# Patient Record
Sex: Male | Born: 1980 | Race: White | Hispanic: No | Marital: Married | Smoking: Former smoker
Health system: Southern US, Community
[De-identification: ages and names within clinical notes are randomized; demographics above are authoritative.]

## PROBLEM LIST (undated history)

## (undated) DIAGNOSIS — F419 Anxiety disorder, unspecified: Secondary | ICD-10-CM

## (undated) HISTORY — PX: FRACTURE SURGERY: SHX138

---

## 2015-11-06 DIAGNOSIS — H6123 Impacted cerumen, bilateral: Secondary | ICD-10-CM | POA: Diagnosis not present

## 2015-11-13 DIAGNOSIS — Z0000 Encounter for general adult medical examination without abnormal findings: Secondary | ICD-10-CM | POA: Diagnosis not present

## 2016-01-21 ENCOUNTER — Emergency Department
Admission: EM | Admit: 2016-01-21 | Discharge: 2016-01-21 | Disposition: A | Discharge status: Home / Self Care | Payer: BLUE CROSS/BLUE SHIELD | Attending: Emergency Medicine | Admitting: Emergency Medicine

## 2016-01-21 ENCOUNTER — Emergency Department: Payer: BLUE CROSS/BLUE SHIELD

## 2016-01-21 DIAGNOSIS — R079 Chest pain, unspecified: Secondary | ICD-10-CM

## 2016-01-21 DIAGNOSIS — Z79899 Other long term (current) drug therapy: Secondary | ICD-10-CM | POA: Insufficient documentation

## 2016-01-21 DIAGNOSIS — Z87891 Personal history of nicotine dependence: Secondary | ICD-10-CM | POA: Insufficient documentation

## 2016-01-21 DIAGNOSIS — K210 Gastro-esophageal reflux disease with esophagitis: Secondary | ICD-10-CM | POA: Diagnosis not present

## 2016-01-21 DIAGNOSIS — I10 Essential (primary) hypertension: Secondary | ICD-10-CM | POA: Diagnosis not present

## 2016-01-21 DIAGNOSIS — E784 Other hyperlipidemia: Secondary | ICD-10-CM | POA: Diagnosis not present

## 2016-01-21 DIAGNOSIS — R0789 Other chest pain: Secondary | ICD-10-CM | POA: Diagnosis not present

## 2016-01-21 HISTORY — DX: Anxiety disorder, unspecified: F41.9

## 2016-01-21 LAB — CBC
HEMATOCRIT: 45.4 % (ref 40.0–52.0)
HEMOGLOBIN: 15.9 g/dL (ref 13.0–18.0)
MCH: 30.9 pg (ref 26.0–34.0)
MCHC: 35 g/dL (ref 32.0–36.0)
MCV: 88.4 fL (ref 80.0–100.0)
Platelets: 331 10*3/uL (ref 150–440)
RBC: 5.14 MIL/uL (ref 4.40–5.90)
RDW: 13.3 % (ref 11.5–14.5)
WBC: 10.6 10*3/uL (ref 3.8–10.6)

## 2016-01-21 LAB — BASIC METABOLIC PANEL
ANION GAP: 7 (ref 5–15)
BUN: 15 mg/dL (ref 6–20)
CHLORIDE: 103 mmol/L (ref 101–111)
CO2: 26 mmol/L (ref 22–32)
Calcium: 9.7 mg/dL (ref 8.9–10.3)
Creatinine, Ser: 0.82 mg/dL (ref 0.61–1.24)
GFR calc Af Amer: >60 mL/min (ref >60)
GLUCOSE: 95 mg/dL (ref 65–99)
POTASSIUM: 3.9 mmol/L (ref 3.5–5.1)
Sodium: 136 mmol/L (ref 135–145)

## 2016-01-21 LAB — TROPONIN I: Troponin I: <0.03 ng/mL (ref <0.03)

## 2016-01-21 MED ORDER — ASPIRIN 81 MG PO CHEW
324.0000 mg | CHEWABLE_TABLET | Freq: Once | ORAL | Status: AC
  Administered 2016-01-21: 324 mg via ORAL
  Filled 2016-01-21: qty 4

## 2016-01-21 NOTE — Discharge Instructions (Signed)

## 2016-01-21 NOTE — ED Provider Notes (Signed)
[facility] Emergency Department Provider Note  ____________________________________________  Time seen: Approximately 11:07 AM  I have reviewed the triage vital signs and the nursing notes.   HISTORY  Chief Complaint Chest Pain   HPI Patrick Hudson is a 35 y.o. male with a history of anxiety and former smoker who presents for evaluation of chest pain. Patient reports multiple daily episodes of left-sided chest pain radiating to his shoulder blade and his left arm the last 10-15 minutes at a time and resolved with no intervention. These episodes happened both at rest and with exertion. His last episode was at 8:30 AM this morning while he was driving to work. He reports that the pain is usually moderate, dull, not associated with shortness of breath, diaphoresis, nausea, vomiting. Patient reports history of a heart attack in his grandfather in his 7560s. Patient denies any other family history of ischemic heart disease. Patient stopped smoking 9 years ago. No personal or family history of blood clots, no recent travel or immobilization, no leg pain or swelling, no exogenous hormones, no hemoptysis. Patient's last physical exam was in September and everything was normal.  Past Medical History:  Diagnosis Date  . Anxiety     There are no active problems to display for this patient.   Past Surgical History:  Procedure Laterality Date  . FRACTURE SURGERY      Prior to Admission medications   Medication Sig Start Date End Date Taking? Authorizing Provider  citalopram (CELEXA) 20 MG tablet Take 1 tablet by mouth daily. 12/23/15  Yes Historical Provider, MD  LORazepam (ATIVAN) 1 MG tablet Take 1 tablet by mouth at bedtime.  10/23/15  Yes Historical Provider, MD  ranitidine (ZANTAC) 300 MG tablet Take 1 tablet by mouth daily. 10/23/15  Yes Historical Provider, MD    Allergies Patient has no known allergies.  No family history on file.  Social  History Social History  Substance Use Topics  . Smoking status: Former Games developermoker  . Smokeless tobacco: Former NeurosurgeonUser  . Alcohol use No    Review of Systems  Constitutional: Negative for fever. Eyes: Negative for visual changes. ENT: Negative for sore throat. Neck: No neck pain  Cardiovascular: + chest pain. Respiratory: Negative for shortness of breath. Gastrointestinal: Negative for abdominal pain, vomiting or diarrhea. Genitourinary: Negative for dysuria. Musculoskeletal: Negative for back pain. Skin: Negative for rash. Neurological: Negative for headaches, weakness or numbness. Psych: No SI or HI  ____________________________________________   PHYSICAL EXAM:  VITAL SIGNS: ED Triage Vitals  Enc Vitals Group     BP 01/21/16 0947 128/84     Pulse Rate 01/21/16 0947 75     Resp 01/21/16 0947 18     Temp 01/21/16 0947 97.5 F (36.4 C)     Temp Source 01/21/16 0947 Oral     SpO2 01/21/16 0947 100 %     Weight 01/21/16 0948 269 lb (122 kg)     Height 01/21/16 0948 6\' 1"  (1.854 m)     Head Circumference --      Peak Flow --      Pain Score --      Pain Loc --      Pain Edu? --      Excl. in GC? --     Constitutional: Alert and oriented. Well appearing and in no apparent distress. HEENT:      Head: Normocephalic and atraumatic.         Eyes: Conjunctivae are normal. Sclera is  non-icteric. EOMI. PERRL      Mouth/Throat: Mucous membranes are moist.       Neck: Supple with no signs of meningismus. Cardiovascular: Regular rate and rhythm. No murmurs, gallops, or rubs. 2+ symmetrical distal pulses are present in all extremities. No JVD. Respiratory: Normal respiratory effort. Lungs are clear to auscultation bilaterally. No wheezes, crackles, or rhonchi.  Gastrointestinal: Soft, non tender, and non distended with positive bowel sounds. No rebound or guarding. Genitourinary: No CVA tenderness. Musculoskeletal: Nontender with normal range of motion in all extremities. No edema,  cyanosis, or erythema of extremities. Neurologic: Normal speech and language. Face is symmetric. Moving all extremities. No gross focal neurologic deficits are appreciated. Skin: Skin is warm, dry and intact. No rash noted. Psychiatric: Mood and affect are normal. Speech and behavior are normal.  ____________________________________________   LABS (all labs ordered are listed, but only abnormal results are displayed)  Labs Reviewed  BASIC METABOLIC PANEL  CBC  TROPONIN I  TROPONIN I   ____________________________________________  EKG  ED ECG REPORT I, Nita Sicklearolina Jaedin Trumbo, the attending physician, personally viewed and interpreted this ECG.  Normal sinus rhythm, rate of 87, normal intervals, normal axis, no ST elevations or depressions. No prior for comparison ____________________________________________  RADIOLOGY  CXR:  Negative ____________________________________________   PROCEDURES  Procedure(s) performed: None Procedures Critical Care performed:  None ____________________________________________   INITIAL IMPRESSION / ASSESSMENT AND PLAN / ED COURSE  Chest pain in a 35 y.o. male with low suspicion for cardiac (HEART score 2) or other serious etiology (including aortic dissection, pneumonia, pneumothorax, or pulmonary embolism) based his history and physical exam in the ED today. EKG normal. Plan for labs including CBC, chemistries and troponin now and in 3 hours, CXR and re-evaluation for disposition. Will give full dose ASA . Will observe patient on cardiac monitor while in the ED and pain control. I discussed admission vs close f/u with cardiology for stress test as outpatient and patient would like to f/u with cardiology as outpatient. Will discuss with cardiology for a close follow up.     Clinical Course as of Jan 21 1332  Tue Jan 21, 2016  1114 Discussed patient's presentation, vitals, lab and EKG with DR. Welton FlakesKhan, cardiology on call who will se patient for f/u  today at [facility] in his clinic. 2nd troponin pending at 1PM, if negative will dc with f/u at [facility].  [CV]    Clinical Course User Index [CV] Nita Sicklearolina Dameion Briles, MD    Pertinent labs & imaging results that were available during my care of the patient were reviewed by me and considered in my medical decision making (see chart for details).    ____________________________________________   FINAL CLINICAL IMPRESSION(S) / ED DIAGNOSES  Final diagnoses:  Chest pain, unspecified type      NEW MEDICATIONS STARTED DURING THIS VISIT:  New Prescriptions   No medications on file     Note:  This document was prepared using Dragon voice recognition software and may include unintentional dictation errors.    Nita Sicklearolina Nasim Habeeb, MD 01/21/16 [phone]

## 2016-01-21 NOTE — ED Notes (Signed)
Patient returned to treatment room from xray. Patient is in no obvious distress at this time.

## 2016-01-21 NOTE — ED Triage Notes (Signed)
Pt c/o intermittent left side chest pain that radiates into the left arm for the past couple of days.. Pt states he has anxiety, states he is suppose to take ativan when he has an attack but did not today because it felt different..Patrick Hudson

## 2016-01-22 DIAGNOSIS — R0789 Other chest pain: Secondary | ICD-10-CM | POA: Diagnosis not present

## 2016-01-22 DIAGNOSIS — K219 Gastro-esophageal reflux disease without esophagitis: Secondary | ICD-10-CM | POA: Diagnosis not present

## 2016-01-22 DIAGNOSIS — R079 Chest pain, unspecified: Secondary | ICD-10-CM | POA: Diagnosis not present

## 2016-01-24 DIAGNOSIS — K210 Gastro-esophageal reflux disease with esophagitis: Secondary | ICD-10-CM | POA: Diagnosis not present

## 2016-01-24 DIAGNOSIS — E784 Other hyperlipidemia: Secondary | ICD-10-CM | POA: Diagnosis not present

## 2016-01-24 DIAGNOSIS — I10 Essential (primary) hypertension: Secondary | ICD-10-CM | POA: Diagnosis not present

## 2016-01-24 DIAGNOSIS — R079 Chest pain, unspecified: Secondary | ICD-10-CM | POA: Diagnosis not present

## 2016-01-24 DIAGNOSIS — R0789 Other chest pain: Secondary | ICD-10-CM | POA: Diagnosis not present

## 2016-03-26 DIAGNOSIS — R079 Chest pain, unspecified: Secondary | ICD-10-CM | POA: Diagnosis not present

## 2016-03-26 DIAGNOSIS — E784 Other hyperlipidemia: Secondary | ICD-10-CM | POA: Diagnosis not present

## 2016-04-29 DIAGNOSIS — F411 Generalized anxiety disorder: Secondary | ICD-10-CM | POA: Diagnosis not present

## 2016-04-29 DIAGNOSIS — K219 Gastro-esophageal reflux disease without esophagitis: Secondary | ICD-10-CM | POA: Diagnosis not present

## 2016-05-26 DIAGNOSIS — M546 Pain in thoracic spine: Secondary | ICD-10-CM | POA: Diagnosis not present

## 2016-09-24 DIAGNOSIS — I10 Essential (primary) hypertension: Secondary | ICD-10-CM | POA: Diagnosis not present

## 2016-09-24 DIAGNOSIS — K210 Gastro-esophageal reflux disease with esophagitis: Secondary | ICD-10-CM | POA: Diagnosis not present

## 2016-09-24 DIAGNOSIS — E784 Other hyperlipidemia: Secondary | ICD-10-CM | POA: Diagnosis not present

## 2016-10-21 DIAGNOSIS — Z0000 Encounter for general adult medical examination without abnormal findings: Secondary | ICD-10-CM | POA: Diagnosis not present

## 2016-10-21 DIAGNOSIS — M25511 Pain in right shoulder: Secondary | ICD-10-CM | POA: Diagnosis not present

## 2016-10-21 DIAGNOSIS — M546 Pain in thoracic spine: Secondary | ICD-10-CM | POA: Diagnosis not present

## 2016-10-28 DIAGNOSIS — K219 Gastro-esophageal reflux disease without esophagitis: Secondary | ICD-10-CM | POA: Diagnosis not present

## 2016-10-28 DIAGNOSIS — M542 Cervicalgia: Secondary | ICD-10-CM | POA: Diagnosis not present

## 2016-10-28 DIAGNOSIS — F411 Generalized anxiety disorder: Secondary | ICD-10-CM | POA: Diagnosis not present

## 2016-10-28 DIAGNOSIS — I10 Essential (primary) hypertension: Secondary | ICD-10-CM | POA: Diagnosis not present

## 2017-10-20 DIAGNOSIS — Z6836 Body mass index (BMI) 36.0-36.9, adult: Secondary | ICD-10-CM | POA: Diagnosis not present

## 2017-10-20 DIAGNOSIS — Z0000 Encounter for general adult medical examination without abnormal findings: Secondary | ICD-10-CM | POA: Diagnosis not present

## 2017-12-21 IMAGING — CR DG CHEST 2V
2 series · 2 of 2 positions shown · non-contrast
Comparison: None.

CLINICAL DATA: Chest pain intermittently for 2-3 days.

EXAM:
CHEST  2 VIEW

[chest pa]
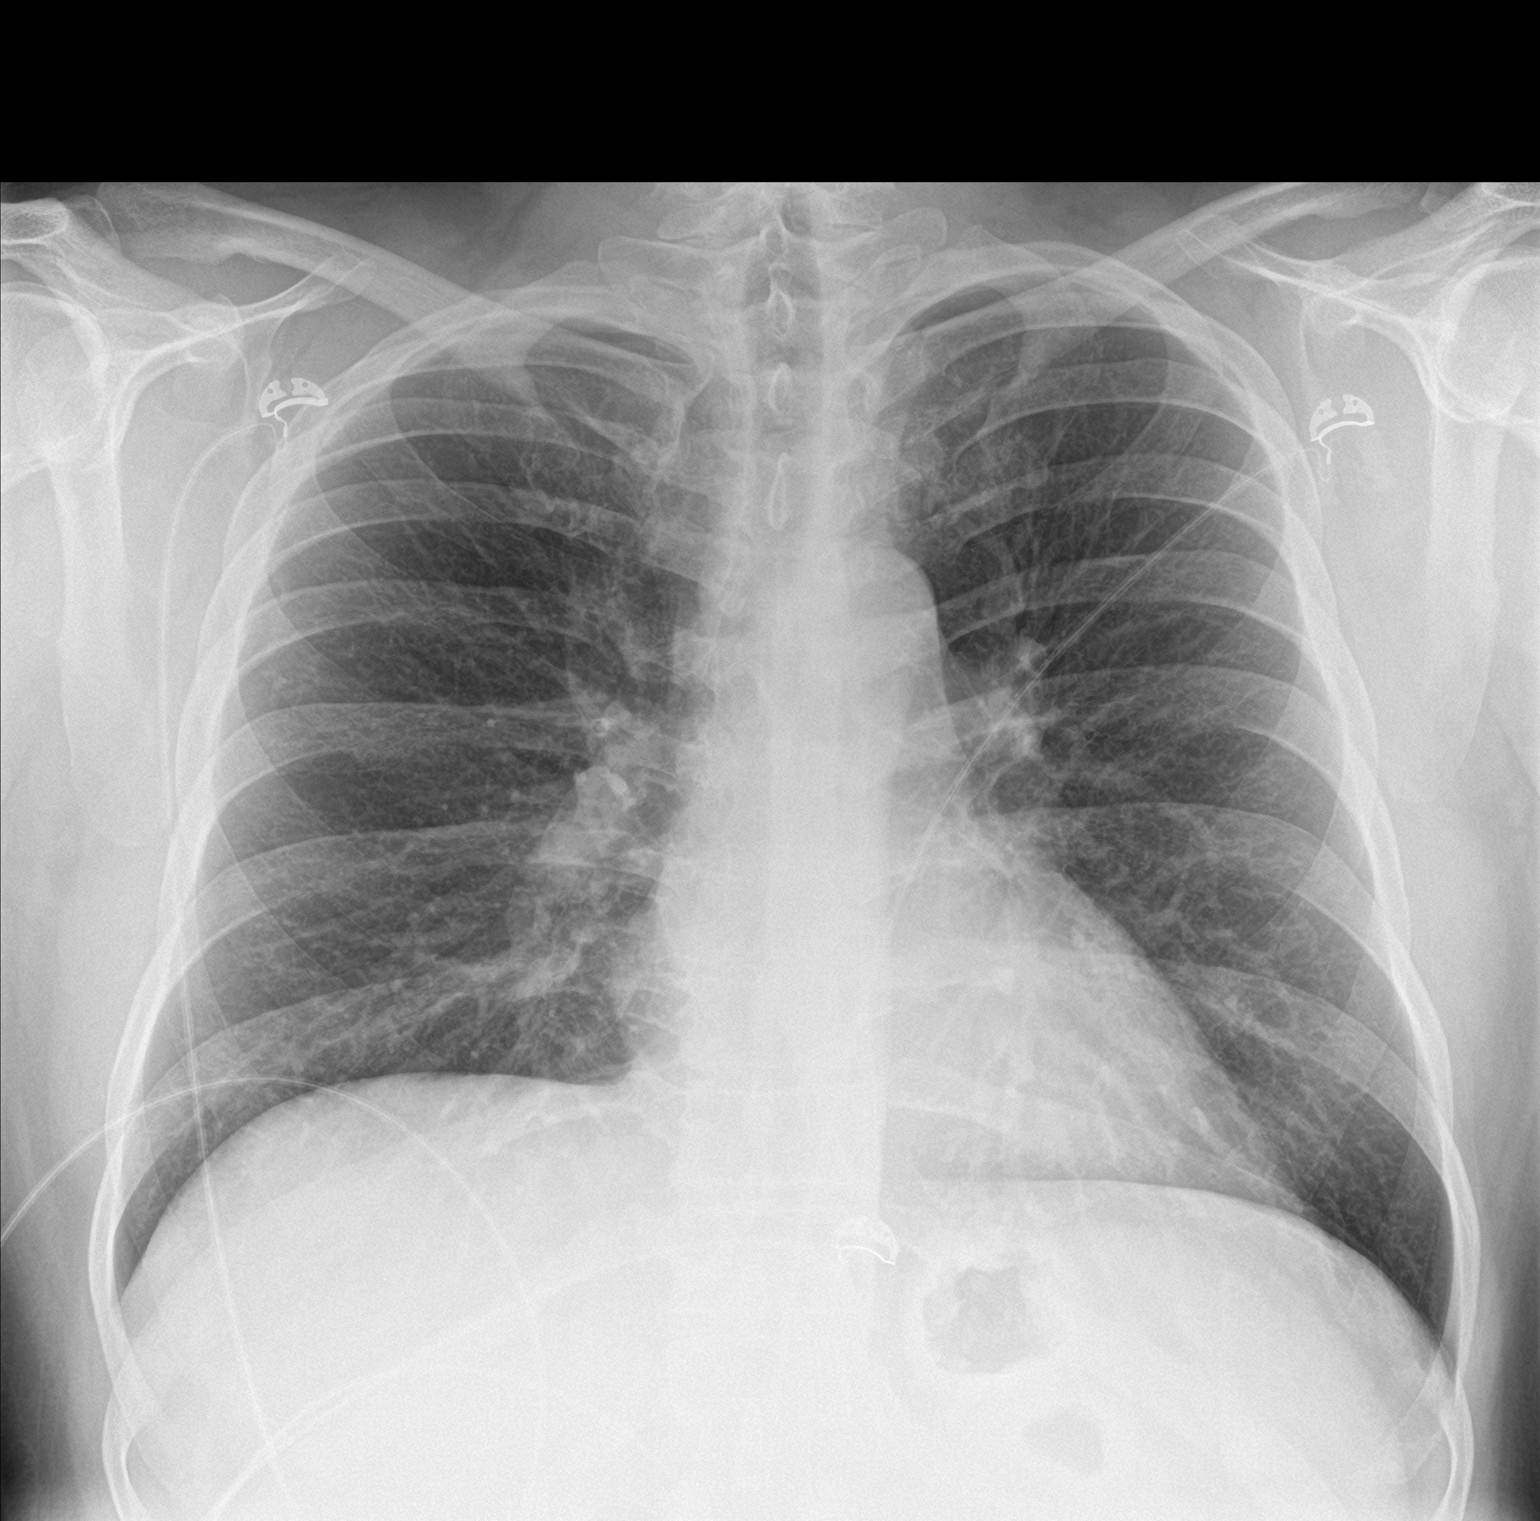

[chest lat]
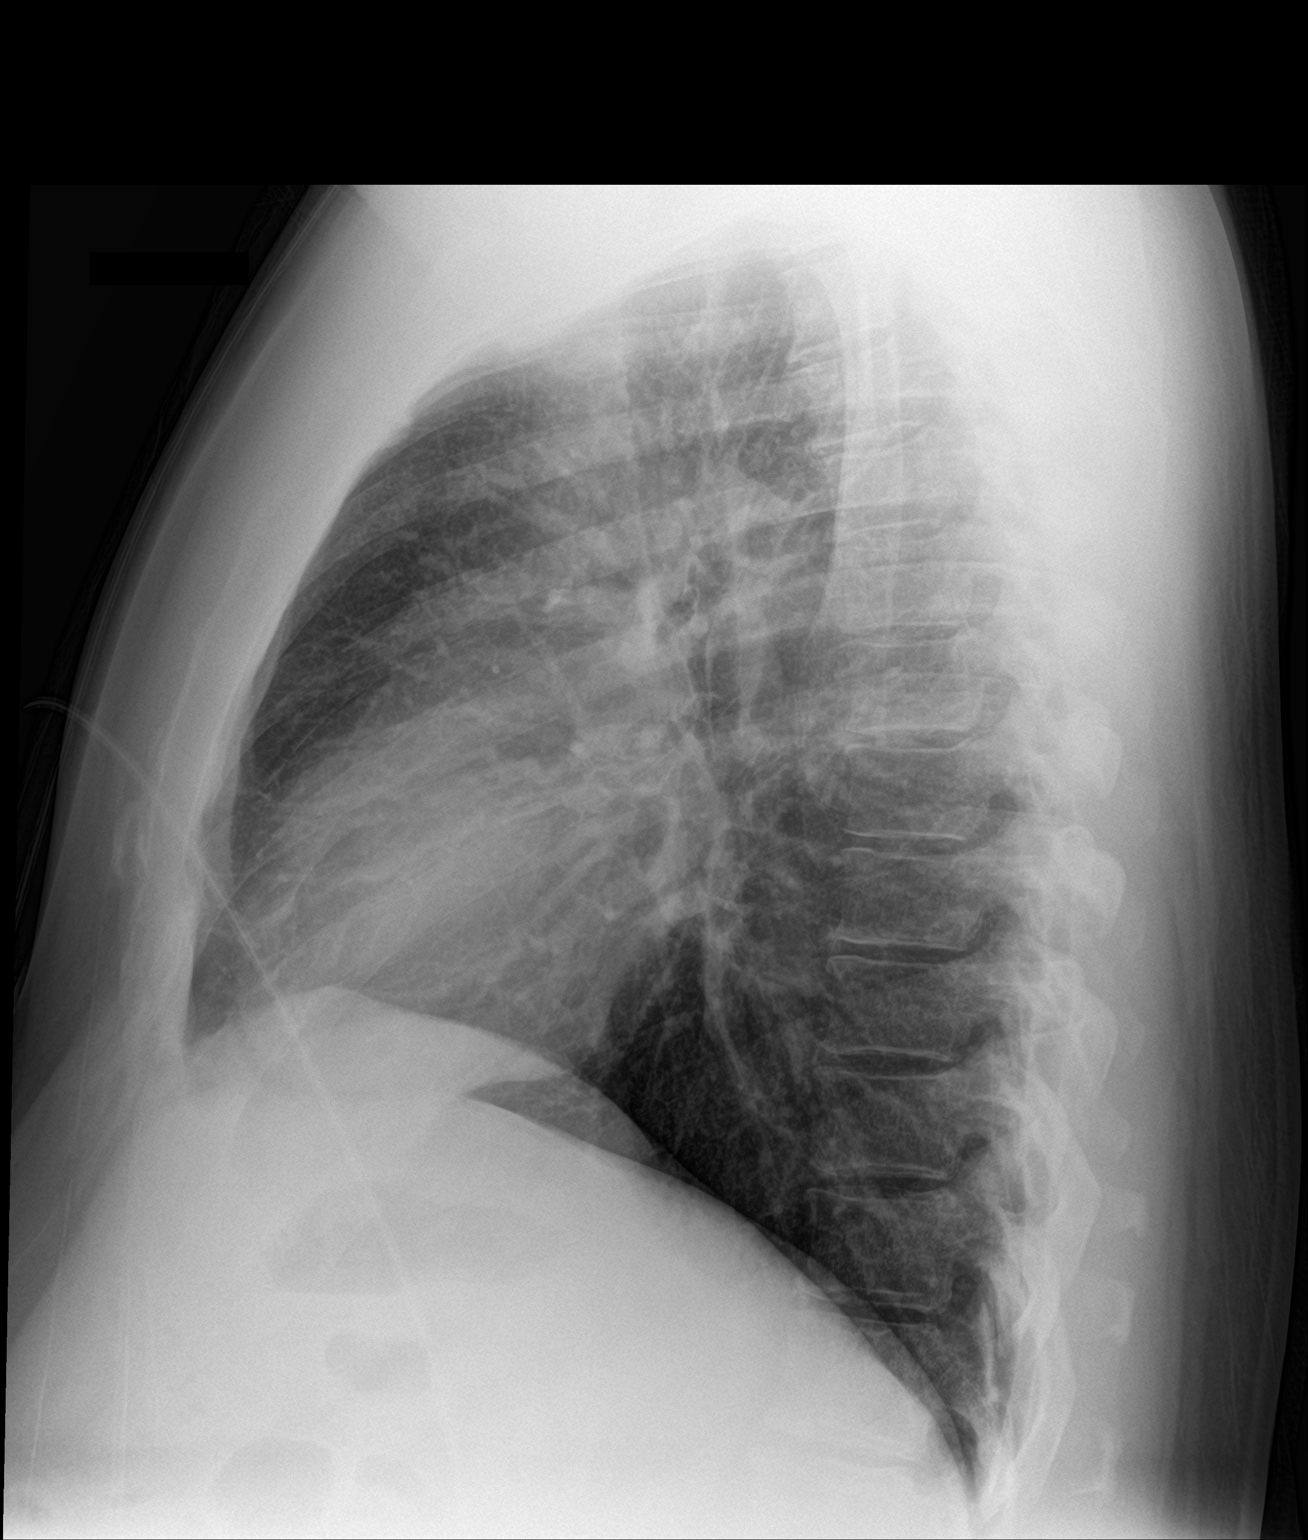

[2 of 2 positions shown; findings below may reference images not displayed]

FINDINGS: The heart size and mediastinal contours are within normal limits.
Both lungs are clear. The visualized skeletal structures are
unremarkable.
IMPRESSION: No active cardiopulmonary disease.

## 2018-06-22 DIAGNOSIS — K219 Gastro-esophageal reflux disease without esophagitis: Secondary | ICD-10-CM | POA: Diagnosis not present

## 2018-06-22 DIAGNOSIS — I10 Essential (primary) hypertension: Secondary | ICD-10-CM | POA: Diagnosis not present

## 2018-06-22 DIAGNOSIS — F411 Generalized anxiety disorder: Secondary | ICD-10-CM | POA: Diagnosis not present

## 2018-06-22 DIAGNOSIS — Z1159 Encounter for screening for other viral diseases: Secondary | ICD-10-CM | POA: Diagnosis not present

## 2018-09-20 DIAGNOSIS — S29019A Strain of muscle and tendon of unspecified wall of thorax, initial encounter: Secondary | ICD-10-CM | POA: Diagnosis not present

## 2018-10-19 DIAGNOSIS — I10 Essential (primary) hypertension: Secondary | ICD-10-CM | POA: Diagnosis not present

## 2018-10-19 DIAGNOSIS — F411 Generalized anxiety disorder: Secondary | ICD-10-CM | POA: Diagnosis not present

## 2018-10-19 DIAGNOSIS — K219 Gastro-esophageal reflux disease without esophagitis: Secondary | ICD-10-CM | POA: Diagnosis not present

## 2018-10-19 DIAGNOSIS — Z13220 Encounter for screening for lipoid disorders: Secondary | ICD-10-CM | POA: Diagnosis not present

## 2018-10-19 DIAGNOSIS — Z1159 Encounter for screening for other viral diseases: Secondary | ICD-10-CM | POA: Diagnosis not present

## 2019-07-16 DIAGNOSIS — Z87891 Personal history of nicotine dependence: Secondary | ICD-10-CM | POA: Diagnosis not present

## 2019-07-16 DIAGNOSIS — K760 Fatty (change of) liver, not elsewhere classified: Secondary | ICD-10-CM | POA: Diagnosis not present

## 2019-07-16 DIAGNOSIS — R109 Unspecified abdominal pain: Secondary | ICD-10-CM | POA: Diagnosis not present

## 2019-07-16 DIAGNOSIS — K5792 Diverticulitis of intestine, part unspecified, without perforation or abscess without bleeding: Secondary | ICD-10-CM | POA: Diagnosis not present

## 2019-07-16 DIAGNOSIS — R1032 Left lower quadrant pain: Secondary | ICD-10-CM | POA: Diagnosis not present

## 2019-08-21 ENCOUNTER — Other Ambulatory Visit: Payer: Self-pay | Admitting: Legal Medicine

## 2019-10-05 DIAGNOSIS — M542 Cervicalgia: Secondary | ICD-10-CM | POA: Diagnosis not present

## 2019-10-08 DIAGNOSIS — Z20828 Contact with and (suspected) exposure to other viral communicable diseases: Secondary | ICD-10-CM | POA: Diagnosis not present

## 2019-10-08 DIAGNOSIS — J019 Acute sinusitis, unspecified: Secondary | ICD-10-CM | POA: Diagnosis not present

## 2019-10-26 DIAGNOSIS — R1011 Right upper quadrant pain: Secondary | ICD-10-CM | POA: Diagnosis not present

## 2019-11-08 DIAGNOSIS — Z20822 Contact with and (suspected) exposure to covid-19: Secondary | ICD-10-CM | POA: Diagnosis not present

## 2019-11-08 DIAGNOSIS — Z20828 Contact with and (suspected) exposure to other viral communicable diseases: Secondary | ICD-10-CM | POA: Diagnosis not present

## 2019-12-07 DIAGNOSIS — K838 Other specified diseases of biliary tract: Secondary | ICD-10-CM | POA: Diagnosis not present

## 2020-01-17 DIAGNOSIS — K838 Other specified diseases of biliary tract: Secondary | ICD-10-CM | POA: Diagnosis not present

## 2020-01-30 DIAGNOSIS — Z1159 Encounter for screening for other viral diseases: Secondary | ICD-10-CM | POA: Diagnosis not present

## 2020-01-30 DIAGNOSIS — Z1152 Encounter for screening for COVID-19: Secondary | ICD-10-CM | POA: Diagnosis not present

## 2020-02-05 DIAGNOSIS — M1990 Unspecified osteoarthritis, unspecified site: Secondary | ICD-10-CM | POA: Diagnosis not present

## 2020-02-05 DIAGNOSIS — E785 Hyperlipidemia, unspecified: Secondary | ICD-10-CM | POA: Diagnosis not present

## 2020-02-05 DIAGNOSIS — K819 Cholecystitis, unspecified: Secondary | ICD-10-CM | POA: Diagnosis not present

## 2020-02-05 DIAGNOSIS — K429 Umbilical hernia without obstruction or gangrene: Secondary | ICD-10-CM | POA: Diagnosis not present

## 2020-02-05 DIAGNOSIS — I10 Essential (primary) hypertension: Secondary | ICD-10-CM | POA: Diagnosis not present

## 2020-02-05 DIAGNOSIS — Z79899 Other long term (current) drug therapy: Secondary | ICD-10-CM | POA: Diagnosis not present

## 2020-02-05 DIAGNOSIS — Z87891 Personal history of nicotine dependence: Secondary | ICD-10-CM | POA: Diagnosis not present

## 2020-02-05 DIAGNOSIS — K838 Other specified diseases of biliary tract: Secondary | ICD-10-CM | POA: Diagnosis not present

## 2020-02-05 DIAGNOSIS — E669 Obesity, unspecified: Secondary | ICD-10-CM | POA: Diagnosis not present

## 2020-02-05 DIAGNOSIS — Z6836 Body mass index (BMI) 36.0-36.9, adult: Secondary | ICD-10-CM | POA: Diagnosis not present

## 2020-02-05 DIAGNOSIS — K839 Disease of biliary tract, unspecified: Secondary | ICD-10-CM | POA: Diagnosis not present

## 2020-02-05 DIAGNOSIS — K8010 Calculus of gallbladder with chronic cholecystitis without obstruction: Secondary | ICD-10-CM | POA: Diagnosis not present

## 2020-02-13 DIAGNOSIS — Z09 Encounter for follow-up examination after completed treatment for conditions other than malignant neoplasm: Secondary | ICD-10-CM | POA: Diagnosis not present

## 2020-06-12 ENCOUNTER — Other Ambulatory Visit: Payer: Self-pay | Admitting: Legal Medicine
